# Patient Record
Sex: Male | Born: 1985 | Race: Black or African American | Hispanic: No | Marital: Single | Smoking: Current every day smoker
Health system: Southern US, Community
[De-identification: ages and names within clinical notes are randomized; demographics above are authoritative.]

## PROBLEM LIST (undated history)

## (undated) DIAGNOSIS — F1910 Other psychoactive substance abuse, uncomplicated: Secondary | ICD-10-CM

## (undated) HISTORY — DX: Other psychoactive substance abuse, uncomplicated: F19.10

---

## 2008-02-10 ENCOUNTER — Emergency Department (HOSPITAL_COMMUNITY): Admission: EM | Admit: 2008-02-10 | Discharge: 2008-02-10 | Payer: Self-pay | Admitting: Emergency Medicine
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
## 2014-01-15 ENCOUNTER — Ambulatory Visit
Admit: 2014-01-15 | Discharge: 2014-01-27 | Disposition: A | Discharge status: Home / Self Care | Payer: Self-pay | Source: Ambulatory Visit | Attending: Psychiatry | Admitting: Psychiatry

## 2014-01-15 DIAGNOSIS — F1920 Other psychoactive substance dependence, uncomplicated: Secondary | ICD-10-CM

## 2014-01-15 DIAGNOSIS — F32A Depression, unspecified: Secondary | ICD-10-CM

## 2014-01-16 NOTE — ED Notes (Signed)
Client called b/c he cannot make priority access appt that was scheduled for him at [facility] by J.Smith (he has a [facility] appt at same time). Writer calls [facility] and reschedules priority access appt for Tues 12/1 0830.
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
## 2014-01-28 ENCOUNTER — Telehealth: Payer: Self-pay

## 2014-01-28 ENCOUNTER — Ambulatory Visit: Payer: Self-pay | Admitting: Family Medicine

## 2014-01-28 ENCOUNTER — Ambulatory Visit
Admit: 2014-01-28 | Discharge: 2014-02-27 | Disposition: A | Discharge status: Home / Self Care | Payer: Self-pay | Source: Ambulatory Visit | Attending: Psychiatry | Admitting: Psychiatry

## 2014-01-28 ENCOUNTER — Encounter: Payer: Self-pay | Admitting: Family Medicine

## 2014-01-28 VITALS — BP 146/82 | HR 72 | Temp 98.2°F | Ht 73.0 in | Wt 239.0 lb

## 2014-01-28 DIAGNOSIS — Z139 Encounter for screening, unspecified: Secondary | ICD-10-CM

## 2014-01-28 DIAGNOSIS — D179 Benign lipomatous neoplasm, unspecified: Secondary | ICD-10-CM | POA: Insufficient documentation

## 2014-01-28 DIAGNOSIS — F1910 Other psychoactive substance abuse, uncomplicated: Secondary | ICD-10-CM

## 2014-01-28 DIAGNOSIS — Z7251 High risk heterosexual behavior: Secondary | ICD-10-CM | POA: Insufficient documentation

## 2014-01-28 LAB — LIPID PANEL
Chol/HDL Ratio: 2.2
Cholesterol: 138 mg/dL
HDL: 64 mg/dL
LDL Calculated: 55 mg/dL
Non HDL Cholesterol: 74 mg/dL
Triglycerides: 94 mg/dL

## 2014-01-28 LAB — CBC
Hematocrit: 45 % (ref 40–51)
Hemoglobin: 14.7 g/dL (ref 13.7–17.5)
MCH: 28 pg/cell (ref 26–32)
MCHC: 33 g/dL (ref 32–37)
MCV: 87 fL (ref 79–92)
Platelets: 280 10*3/uL (ref 150–330)
RBC: 5.2 MIL/uL (ref 4.6–6.1)
RDW: 12.4 % (ref 11.6–14.4)
WBC: 5.5 10*3/uL (ref 4.2–9.1)

## 2014-01-28 LAB — COMPREHENSIVE METABOLIC PANEL
ALT: 288 U/L — ABNORMAL HIGH (ref 0–50)
AST: 94 U/L — ABNORMAL HIGH (ref 0–50)
Albumin: 4.6 g/dL (ref 3.5–5.2)
Alk Phos: 59 U/L (ref 40–130)
Anion Gap: 12 (ref 7–16)
Bilirubin,Total: 0.3 mg/dL (ref 0.0–1.2)
CO2: 26 mmol/L (ref 20–28)
Calcium: 9.4 mg/dL (ref 9.0–10.3)
Chloride: 102 mmol/L (ref 96–108)
Creatinine: 1.13 mg/dL (ref 0.67–1.17)
GFR,Black: 100 *
GFR,Caucasian: 86 *
Glucose: 88 mg/dL (ref 60–99)
Lab: 10 mg/dL (ref 6–20)
Potassium: 4.6 mmol/L (ref 3.3–5.1)
Sodium: 140 mmol/L (ref 133–145)
Total Protein: 7.2 g/dL (ref 6.3–7.7)

## 2014-01-28 NOTE — Telephone Encounter (Signed)
Jasmine from [facility] lab called stating that they received a urine testing order, but the order was put in under clinic collect, but it should be under lab collect. Please follow up with [facility] as soon as possible, thank you.

## 2014-01-28 NOTE — Telephone Encounter (Addendum)
Action:    Lab requesting urine order from today,be changed to lab collect.  Thanks.

## 2014-01-29 LAB — HEPATITIS C ANTIBODY: Hep C Ab: POSITIVE — AB

## 2014-01-29 LAB — CHLAMYDIA PLASMID DNA AMPLIFICATION: Chlamydia Plasmid DNA Amplification: 0

## 2014-01-29 LAB — SYPHILIS SCREEN
Syphilis Screen: NEGATIVE
Syphilis Status: NONREACTIVE

## 2014-01-29 LAB — N. GONORRHOEAE DNA AMPLIFICATION: N. gonorrhoeae DNA Amplification: 0

## 2014-01-30 ENCOUNTER — Encounter: Payer: Self-pay | Admitting: Family Medicine

## 2014-01-30 DIAGNOSIS — F1910 Other psychoactive substance abuse, uncomplicated: Secondary | ICD-10-CM | POA: Insufficient documentation

## 2014-01-30 LAB — HSV 1 ANTIBODY, IGG: HSV 1 IgG: 0.52 IV

## 2014-01-30 LAB — HSV 2 ANTIBODY, IGG: HSV 2 IgG: 0.06 IV

## 2014-01-30 NOTE — Assessment & Plan Note (Signed)
Hx of significant heroine/cocaine/and etoh use.  He moved to [place] to try and get away from some his ongoing substance abuse problems.  He has now been sober for almost 3-4 weeks. He attends NA meetings.

## 2014-01-30 NOTE — Assessment & Plan Note (Signed)
Patient reports to no urinary or penile lesions however hx of significant drug use and unportected intercourse in his past.  He would like testing today for STDs.

## 2014-01-30 NOTE — Assessment & Plan Note (Signed)
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  He reports to no pain in the area.  He has had it evaluated in the past and was advised to continue with conservative tx only.

## 2014-01-30 NOTE — Progress Notes (Signed)
[facility] - Outpatient Progress Note    SUBJECTIVE    Unprotected sexual intercourse  Patient reports to no urinary or penile lesions however hx of significant drug use and unportected intercourse in his past.  He would like testing today for STDs.    Substance abuse  Hx of significant heroine/cocaine/and etoh use.  [place] to try and get away from some his ongoing substance abuse problems.  He has now been sober for almost 3-4 weeks. He attends NA meetings.    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  He reports to no pain in the area.  He has had it evaluated in the past and was advised to continue with conservative tx only.        ROS: see above    Patient's medications, allergies, past medical, surgical, social and family histories were reviewed and updated as appropriate.      OBJECTIVE    Filed Vitals:    01/28/14 1020   BP: 146/82   Pulse: 72   Temp: 36.8 C (98.2 F)   Height: 1.854 m (6\' 1" )   Weight: 108.41 kg (239 lb)       VS reviewed    Gen: NAD  CV: RRR  Back: large 10cm lipoma appreciated on mid pain; free movable on exam        ASSESSMENT & PLAN    1. Unprotected sexual intercourse  Advised protected intercouse; labs ordered as below:    Chlamydia plasmid DNA amplification    Hepatitis C antibody    Syphilis Screen w Rfx to RPR and TPPA    N. Gonorrhoeae DNA amplification    HSV 1 ANTIBODY, IGG    HSV 2 ANTIBODY, IGG   2. Lipoma  Stable; no need for further tx; cont to follow   3. Substance abuse  Encouraged to refrain from use; cont with outpt substance abuse counseling.    4. Screening  Screening and CPE at next available:  Lipid add Rfx to Drt LDL if Trig >400    Comprehensive metabolic panel    CBC         F/u: next available for CPE     Garner Nash, D.O. Dictated using Dragon 10.1

## 2014-02-07 NOTE — ED Notes (Signed)
Mr. Larry Bridges no showed for the rescheduled [facility] appointment on 01/28/14 a 8:30 am. [facility] informed Writer that he no showed three times. Writer left a message for the referrer, Case Closed until further notice.

## 2014-02-11 ENCOUNTER — Telehealth: Payer: Self-pay | Admitting: Family Medicine

## 2014-02-11 ENCOUNTER — Ambulatory Visit: Payer: Self-pay | Admitting: Family Medicine

## 2014-02-11 ENCOUNTER — Encounter: Payer: Self-pay | Admitting: Family Medicine

## 2014-02-11 DIAGNOSIS — B1920 Unspecified viral hepatitis C without hepatic coma: Secondary | ICD-10-CM | POA: Insufficient documentation

## 2014-02-11 NOTE — Telephone Encounter (Signed)
Patient no showed to follow up appointment today.    During this visit he was going to be informed of positive Hepatitis C status.    A phone call was placed to [facility] emergency housing and a message was left for him to call back the nurse line to reschedule his appointment.    No information was shared and he was confirmed to still be staying there so staffer was going to pass message on.    He will need viral load performed to confirm positive test and if confirmed will need further hepatic work up.    Will forward to suite RN so that she is aware of why call was made and that it is important that follow up be rescheduled.    [place], DO

## 2014-02-14 NOTE — Telephone Encounter (Signed)
FYI:    TC to [employer], left verbal message with staff member for pt.to call office, she agreed to [employer]. Thanks.

## 2014-02-17 NOTE — Telephone Encounter (Signed)
FYI:    Pt.returned call, inquired about his lab results; which were reviewed with him and the importance of rescheduling an appt, he agreed and is scheduled for tomorrow morning. Thanks.

## 2014-02-18 ENCOUNTER — Ambulatory Visit: Payer: Self-pay | Admitting: Family Medicine

## 2014-02-19 ENCOUNTER — Ambulatory Visit: Payer: Self-pay | Admitting: Family Medicine

## 2014-02-19 ENCOUNTER — Encounter: Payer: Self-pay | Admitting: Family Medicine

## 2014-02-19 VITALS — BP 152/73 | HR 66 | Temp 97.9°F | Ht 72.84 in | Wt 234.0 lb

## 2014-02-19 DIAGNOSIS — B1920 Unspecified viral hepatitis C without hepatic coma: Secondary | ICD-10-CM

## 2014-02-19 DIAGNOSIS — Z7251 High risk heterosexual behavior: Secondary | ICD-10-CM

## 2014-02-19 NOTE — Patient Instructions (Signed)
Hepatitis C Discharge Instructions    About this topic   Hepatitis C is a disease that harms the liver. It is caused by a virus and can either be acute or chronic. The liver becomes damaged and does not work well. The virus spreads from person to person through contact with blood. Two ways this may happen are having unprotected sex or sharing needles. Also, children born to a mother with hepatitis C may get hepatitis C. There are many other ways you may get hepatitis C. It is the most serious kind of hepatitis. Many people have hepatitis C but they do not know it.  Treatment involves drugs and sometimes a liver transplant.       What care is needed at home?    Ask your doctor what you need to do when you go home. Make sure you ask questions if you do not understand what the doctor says. This way you will know what you need to do.   Take all of your drugs exactly as the doctor ordered. You may want to use an alarm or talking pillbox to help you remember to take each dose on time.   Never stop taking your drugs or change the dose without asking your doctor first.   Heat may be used to help with belly pain. If your doctor tells you to use heat, put a heating pad on your belly for no more than 20 minutes at a time. Never go to sleep with a heating pad on as this can cause burns.   Drink 6 to 8 glasses of water each day.   Do not drink beer, wine, and mixed drinks (alcohol).   Avoid taking drugs and substances that can cause more harm to your liver. Ask your doctor before taking any drugs, vitamins, or supplements.  What follow-up care is needed?   Your condition needs close monitoring. Your doctor may ask you to make visits to the office to check on your progress. Be sure to keep these visits.  What drugs may be needed?   The doctor may order drugs to:   Help with pain   Prevent infection   Slow or stop the virus from damaging your liver   Protect you from other kinds of hepatitis  The drugs may cause  side effects. Talk to your doctor if you are having problems taking any of your drugs.  Will physical activity be limited?   You may have to limit your activity. Talk to your doctor about the right amount of activity for you.  What changes to diet are needed?   Eating a healthy diet is important during this time. This means:   Eat whole grain foods and foods high in fiber.   Choose many different fruits and vegetables. Fresh or frozen is best.   Cut back on solid fats like butter or margarine. Eat less fatty and processed foods.   Eat more low-fat or lean meats like chicken, fish, or [place]. Eat less red meat.   Avoid caffeine.   If you need help, ask to see a dietitian.  What problems could happen?    Liver damage   Liver failure  What can be done to prevent this health problem?    Do not share anything that might have blood on it. This includes razors, clippers, toothbrush, needles, etc.   Wear gloves if you need to handle an infected person's blood.   Use a condom each time you have sex. Limit  your number of sexual partners.   Make sure your tattoo artist uses sterile tools.   Do not donate blood if you have hepatitis C. Remove your donor status from your state ID card.   Handle used needles and cutting tools with care.   Place used needles in a properly labeled container.   Cover cuts and wounds with a clean dressing.  When do I need to call the doctor?    Signs of infection. These include a fever of 100.70F (38C) or higher, chills.    Throwing up, upset stomach, or loose stools that persist   Bloody or black stools   Very dark yellow urine   Yellow skin or eyes   Swelling in your belly   Itching   You are not feeling better in 2 to 3 days or you are feeling worse  Teach Back: Helping You Understand   The Teach Back Method helps you understand the information we are giving you. The idea is simple. After talking with the staff, tell them in your own words what you were just told. This  helps to make sure the staff has covered each thing clearly. It also helps to explain things that may have been a bit confusing. Before going home, make sure you are able to do these:   I can tell you about my condition.   I can tell you what changes I need to make with my diet or drugs.   I can tell you what I will do if I have problems with throwing up, upset stomach, or loose stools or my skin or eyes are yellow.  Where can I learn more?   [place]  http://www.liverfoundation.org/abouttheliver/info/hepatitisc/   Centers for Disease Control and Prevention  [employer].com.au   National Digestive Diseases Information Clearinghouse  http://digestive.[employer].co.nz   Last Reviewed Date   2012-09-07  Consumer Information Use and Disclaimer   This information is not specific medical advice and does not replace information you receive from your health care provider. This is only a brief summary of general information. It does NOT include all information about conditions, illnesses, injuries, tests, procedures, treatments, therapies, discharge instructions or life-style choices that may apply to you. You must talk with your health care provider for complete information about your health and treatment options. This information should not be used to decide whether or not to accept your health care providers advice, instructions or recommendations. Only your health care provider has the knowledge and training to provide advice that is right for you.  Copyright   Copyright  2015 [place] and its affiliates and/or licensors. All rights reserved.

## 2014-02-19 NOTE — Addendum Note (Signed)
Addended by: Lawana Chambers on: 02/19/2014 09:24 AM     Modules accepted: Orders

## 2014-02-19 NOTE — Progress Notes (Signed)
HPI:  Hepatitis C:  Patient came in on 01/28/14 after an episode of unprotected intercourse with a male partner.  He had a full panel of STD testing.  He was positive for Hepatitis C antibody.  The urine for CT/GC leaked in transit and will be collected again today.  He has not been able to come in for his results until today.  He reports IV drug use in past 1-2 years and he is not sure if he shared needles.  He also reports several unprotected sexual encounters in his lifetime.  He reports that he has been trying to "clean up my life" and that "everything happens for a reason".  He states that he thinks this may be the event that helps him change his life for the better. Praised him for his efforts.     ROS:  As above    SOC:  Denies alcohol use    Patient's current medications, allergies, and problem list were reviewed and updated.     OBJECTIVE:  BP 152/73 mmHg   Pulse 66   Temp(Src) 36.6 C (97.9 F) (Temporal)   Ht 1.85 m (6' 0.83")   Wt 106.142 kg (234 lb)   BMI 31.01 kg/m2  General appearance: alert, appears stated age, cooperative and no distress  Eyes: conjunctivae/corneas clear. PERRL,   Throat: lips, mucosa, and tongue normal; teeth and gums normal  Lungs: clear to auscultation bilaterally  Heart: regular rate and rhythm, S1, S2 normal, no murmur, click, rub or gallop  Abdomen: soft, non-tender; bowel sounds normal; no masses,  no organomegaly  Extremities: extremities normal, atraumatic, no cyanosis or edema  Neurologic: Grossly normal    ASSESSMENT:  Positive Hepatitis C antibody    PLAN:   - Discussed disease process and treatment options, handout provided   - Labs already ordered for Hep C PCR and urine GC/CT, added HIV   - Referral to [facility] at [facility]   - Avoid alcohol, acetaminophen, and continue to improve diet   - Follow-up in 4-5 weeks after seeing GI    Patient verbalized understanding of information presented,and confirmed agreement with current plan of care.  Precautions  reviewed.

## 2014-02-20 LAB — HEPATITIS C RNA, QUANTITATIVE, PCR
HCV PCR log: 5.1 IU/mL
HCV PCR: 132022 IU/mL

## 2014-02-20 LAB — HIV 1&2 ANTIGEN/ANTIBODY: HIV 1&2 ANTIGEN/ANTIBODY: NONREACTIVE

## 2014-02-20 LAB — CHLAMYDIA PLASMID DNA AMPLIFICATION: Chlamydia Plasmid DNA Amplification: 0

## 2014-02-20 LAB — N. GONORRHOEAE DNA AMPLIFICATION: N. gonorrhoeae DNA Amplification: 0

## 2014-02-24 ENCOUNTER — Telehealth: Payer: Self-pay | Admitting: Family Medicine

## 2014-02-24 NOTE — Telephone Encounter (Signed)
Left HIPAA appropriate message with mother for patient to call back.

## 2014-02-25 NOTE — Telephone Encounter (Signed)
FYI:    Pt.returned call and lab results from 02/19/14 reviewed, voiced understanding..Thanks.

## 2014-03-31 ENCOUNTER — Ambulatory Visit: Payer: Self-pay | Admitting: Family Medicine

## 2014-05-09 ENCOUNTER — Encounter: Payer: Self-pay | Admitting: Gastroenterology

## 2015-05-07 DIAGNOSIS — Z139 Encounter for screening, unspecified: Secondary | ICD-10-CM

## 2015-05-12 NOTE — Congregational Nurse Program (Signed)
Congregational Nurse Program Note  Date of Encounter: 05/07/2015  Past Medical History: No past medical history on file.  Encounter Details:     CNP Questionnaire - 05/07/15 1601    Patient Demographics   Is this a new or existing patient? New   Patient is considered a/an Not Applicable   Race African-American/Black   Patient Assistance   Location of Patient Assistance Not Applicable   Patient's financial/insurance status Low Income;Self-Pay   Uninsured Patient Yes   Interventions Counseled to make appt. with provider   Patient referred to apply for the following financial assistance [employer]   Food insecurities addressed Provided food supplies   Transportation assistance No   Assistance securing medications No   Educational health offerings Hypertension;Chronic disease;Navigating the healthcare system   Encounter Details   Primary purpose of visit Education/Health Concerns;Chronic Illness/Condition Visit   Was an Emergency Department visit averted? Not Applicable   Does patient have a medical provider? No   Patient referred to Clinic   Was a mental health screening completed? (GAINS tool) No   Does patient have dental issues? No   Does patient have vision issues? No   Since previous encounter, have you referred patient for abnormal blood pressure that resulted in a new diagnosis or medication change? No   Since previous encounter, have you referred patient for abnormal blood glucose that resulted in a new diagnosis or medication change? No   For [employer]straction Use Only   Does patient have insurance? No       B/P check

## 2016-07-22 ENCOUNTER — Other Ambulatory Visit: Payer: Self-pay

## 2016-07-22 NOTE — Progress Notes (Signed)
Writer called patient to schedule routine physical exam per [employer]. Unable to leave a message- mailbox full.

## 2016-07-29 ENCOUNTER — Other Ambulatory Visit
Admission: RE | Admit: 2016-07-29 | Discharge: 2016-07-29 | Disposition: A | Discharge status: Home / Self Care | Payer: Medicaid Other | Source: Ambulatory Visit | Attending: Family Medicine | Admitting: Family Medicine

## 2016-07-29 ENCOUNTER — Other Ambulatory Visit: Payer: Self-pay

## 2016-07-29 DIAGNOSIS — Z113 Encounter for screening for infections with a predominantly sexual mode of transmission: Secondary | ICD-10-CM | POA: Insufficient documentation

## 2016-07-29 DIAGNOSIS — Z136 Encounter for screening for cardiovascular disorders: Secondary | ICD-10-CM | POA: Insufficient documentation

## 2016-07-29 DIAGNOSIS — Z118 Encounter for screening for other infectious and parasitic diseases: Secondary | ICD-10-CM | POA: Insufficient documentation

## 2016-07-29 DIAGNOSIS — Z1159 Encounter for screening for other viral diseases: Secondary | ICD-10-CM | POA: Insufficient documentation

## 2016-07-29 DIAGNOSIS — Z13228 Encounter for screening for other metabolic disorders: Secondary | ICD-10-CM | POA: Insufficient documentation

## 2016-07-29 LAB — CBC AND DIFFERENTIAL
Baso # K/uL: 0 10*3/uL (ref 0.0–0.1)
Basophil %: 0.3 %
Eos # K/uL: 0.2 10*3/uL (ref 0.0–0.5)
Eosinophil %: 2.5 %
Hematocrit: 44 % (ref 40–51)
Hemoglobin: 14.5 g/dL (ref 13.7–17.5)
IMM Granulocytes #: 0 10*3/uL (ref 0.0–0.1)
IMM Granulocytes: 0.1 %
Lymph # K/uL: 3.3 10*3/uL (ref 1.3–3.6)
Lymphocyte %: 40.9 %
MCH: 29 pg/cell (ref 26–32)
MCHC: 33 g/dL (ref 32–37)
MCV: 88 fL (ref 79–92)
Mono # K/uL: 0.6 10*3/uL (ref 0.3–0.8)
Monocyte %: 8.1 %
Neut # K/uL: 3.8 10*3/uL (ref 1.8–5.4)
Nucl RBC # K/uL: 0 10*3/uL (ref 0.0–0.0)
Nucl RBC %: 0 /100 WBC (ref 0.0–0.2)
Platelets: 290 10*3/uL (ref 150–330)
RBC: 5.1 MIL/uL (ref 4.6–6.1)
RDW: 13.1 % (ref 11.6–14.4)
Seg Neut %: 48.1 %
WBC: 7.9 10*3/uL (ref 4.2–9.1)

## 2016-07-29 LAB — LIPID PANEL
Chol/HDL Ratio: 2.1
Cholesterol: 144 mg/dL
HDL: 68 mg/dL
LDL Calculated: 55 mg/dL
Non HDL Cholesterol: 76 mg/dL
Triglycerides: 106 mg/dL

## 2016-07-29 LAB — COMPREHENSIVE METABOLIC PANEL
ALT: 140 U/L — ABNORMAL HIGH (ref 0–50)
AST: 60 U/L — ABNORMAL HIGH (ref 0–50)
Albumin: 4.6 g/dL (ref 3.5–5.2)
Alk Phos: 62 U/L (ref 40–130)
Anion Gap: 11 (ref 7–16)
Bilirubin,Total: 0.4 mg/dL (ref 0.0–1.2)
CO2: 28 mmol/L (ref 20–28)
Calcium: 9.6 mg/dL (ref 9.0–10.3)
Chloride: 101 mmol/L (ref 96–108)
Creatinine: 0.91 mg/dL (ref 0.67–1.17)
GFR,Black: 128 *
GFR,Caucasian: 110 *
Glucose: 80 mg/dL (ref 60–99)
Lab: 13 mg/dL (ref 6–20)
Potassium: 4.6 mmol/L (ref 3.3–5.1)
Sodium: 140 mmol/L (ref 133–145)
Total Protein: 7 g/dL (ref 6.3–7.7)

## 2016-07-29 LAB — DATE/TIME NOT PROVIDED

## 2016-07-30 LAB — HEMOGLOBIN A1C: Hemoglobin A1C: 5.4 % (ref 4.0–6.0)

## 2016-08-01 LAB — HEPATITIS C ANTIBODY: Hep C Ab: POSITIVE — AB

## 2016-08-01 LAB — N. GONORRHOEAE DNA AMPLIFICATION: N. gonorrhoeae DNA Amplification: 0

## 2016-08-01 LAB — HEPATITIS C RNA, QUANTITATIVE, PCR

## 2016-08-01 LAB — HEPATITIS C PCR QNT,SERUM
HCV PCR Qnt, ser: 135882 IU/mL
HCV PCR log, ser: 5.1 IU/mL

## 2016-08-01 LAB — CHLAMYDIA PLASMID DNA AMPLIFICATION: Chlamydia Plasmid DNA Amplification: 0

## 2016-08-02 ENCOUNTER — Other Ambulatory Visit: Payer: Self-pay

## 2016-08-02 NOTE — Progress Notes (Signed)
Patient transferred care to [facility] ([facility]).

## 2016-10-10 ENCOUNTER — Emergency Department (HOSPITAL_COMMUNITY)
Admission: EM | Admit: 2016-10-10 | Discharge: 2016-10-11 | Disposition: A | Discharge status: Home / Self Care | Payer: Self-pay | Attending: Emergency Medicine | Admitting: Emergency Medicine

## 2016-10-10 ENCOUNTER — Emergency Department (HOSPITAL_COMMUNITY): Payer: Self-pay

## 2016-10-10 ENCOUNTER — Encounter (HOSPITAL_COMMUNITY): Payer: Self-pay | Admitting: Emergency Medicine

## 2016-10-10 DIAGNOSIS — R42 Dizziness and giddiness: Secondary | ICD-10-CM | POA: Insufficient documentation

## 2016-10-10 DIAGNOSIS — F17210 Nicotine dependence, cigarettes, uncomplicated: Secondary | ICD-10-CM | POA: Insufficient documentation

## 2016-10-10 DIAGNOSIS — R002 Palpitations: Secondary | ICD-10-CM | POA: Insufficient documentation

## 2016-10-10 LAB — BASIC METABOLIC PANEL
ANION GAP: 10 (ref 5–15)
BUN: 13 mg/dL (ref 6–20)
CHLORIDE: 105 mmol/L (ref 101–111)
CO2: 22 mmol/L (ref 22–32)
Calcium: 9.1 mg/dL (ref 8.9–10.3)
Creatinine, Ser: 0.95 mg/dL (ref 0.61–1.24)
GFR calc Af Amer: >60 mL/min (ref >60)
GFR calc non Af Amer: >60 mL/min (ref >60)
GLUCOSE: 83 mg/dL (ref 65–99)
POTASSIUM: 3.8 mmol/L (ref 3.5–5.1)
Sodium: 137 mmol/L (ref 135–145)

## 2016-10-10 LAB — CBC
HEMATOCRIT: 40.4 % (ref 39.0–52.0)
HEMOGLOBIN: 13.6 g/dL (ref 13.0–17.0)
MCH: 28.5 pg (ref 26.0–34.0)
MCHC: 33.7 g/dL (ref 30.0–36.0)
MCV: 84.7 fL (ref 78.0–100.0)
Platelets: 262 10*3/uL (ref 150–400)
RBC: 4.77 MIL/uL (ref 4.22–5.81)
RDW: 13.2 % (ref 11.5–15.5)
WBC: 8.3 10*3/uL (ref 4.0–10.5)

## 2016-10-10 LAB — I-STAT TROPONIN, ED: TROPONIN I, POC: 0.01 ng/mL (ref 0.00–0.08)

## 2016-10-10 NOTE — ED Triage Notes (Signed)
Pt presents to ED for intermittent chest pain, shortness of breath, palpitations and dizziness x 3 days, especially when at work.  Pt denies CP at this time.

## 2016-10-11 NOTE — ED Provider Notes (Signed)
[facility]-EMERGENCY DEPT Provider Note   CSN: [id number] Arrival date & time: 10/10/16  2049     History   Chief Complaint Chief Complaint  Patient presents with  . Palpitations  . Dizziness    HPI Charles Pham is a 31 y.o. male.  Patient presents to the emergency department with chief complaint of heart palpitations and lightheadedness. He states that his symptoms started today when he was under a lot of stress thinking about all the things he had to do. He states that he has had this happen in the past when he has been dealing with a lot of stress. He denies any shortness of breath. States that his symptoms have mostly improved. He denies any drug or alcohol use. Denies any heart or lung problems. Denies any history of PE or DVT. He has not taken anything for symptoms. There are no modifying factors.   The history is provided by the patient. No language interpreter was used.    No past medical history on file.  There are no active problems to display for this patient.   No past surgical history on file.     Home Medications    Prior to Admission medications   Not on File    Family History No family history on file.  Social History Social History  Substance Use Topics  . Smoking status: Current Every Day Smoker    Packs/day: 1.00  . Smokeless tobacco: Never Used  . Alcohol use Yes     Comment: socially     Allergies   Patient has no known allergies.   Review of Systems Review of Systems  All other systems reviewed and are negative.    Physical Exam Updated Vital Signs BP 129/90   Pulse (!) 50   Temp 98 F (36.7 C) (Oral)   Resp 12   Ht 6\' 1"  (1.854 m)   Wt 90.7 kg (200 lb)   SpO2 99%   BMI 26.39 kg/m   Physical Exam  Constitutional: He is oriented to person, place, and time. He appears well-developed and well-nourished.  HENT:  Head: Normocephalic and atraumatic.  Eyes: Pupils are equal, round, and reactive to light. Conjunctivae and EOM are  normal. Right eye exhibits no discharge. Left eye exhibits no discharge. No scleral icterus.  Neck: Normal range of motion. Neck supple. No JVD present.  Cardiovascular: Normal rate, regular rhythm and normal heart sounds.  Exam reveals no gallop and no friction rub.   No murmur heard. Pulmonary/Chest: Effort normal and breath sounds normal. No respiratory distress. He has no wheezes. He has no rales. He exhibits no tenderness.  Abdominal: Soft. He exhibits no distension and no mass. There is no tenderness. There is no rebound and no guarding.  Musculoskeletal: Normal range of motion. He exhibits no edema or tenderness.  Neurological: He is alert and oriented to person, place, and time.  Skin: Skin is warm and dry.  Psychiatric: He has a normal mood and affect. His behavior is normal. Judgment and thought content normal.  Nursing note and vitals reviewed.    ED Treatments / Results  Labs (all labs ordered are listed, but only abnormal results are displayed) Labs Reviewed  BASIC METABOLIC PANEL  CBC  I-STAT TROPONIN, ED    EKG  EKG Interpretation  Date/Time:  Monday October 10 2016 20:59:11 EDT Ventricular Rate:  86 PR Interval:  168 QRS Duration: 82 QT Interval:  372 QTC Calculation: 445 R Axis:   80 Text Interpretation:  Normal sinus rhythm Minimal voltage criteria for LVH, may be normal variant Cannot rule out Anterior infarct , age undetermined Abnormal ECG No previous ECGs available Confirmed by Zadie Rhine ([id number]) on 10/10/2016 11:57:51 PM       Radiology Dg Chest 2 View  Result Date: 10/10/2016 CLINICAL DATA:  Chest pain with shortness of breath EXAM: CHEST  2 VIEW COMPARISON:  None. FINDINGS: The heart size and mediastinal contours are within normal limits. Both lungs are clear. The visualized skeletal structures are unremarkable. IMPRESSION: No active cardiopulmonary disease. Electronically Signed   By: Jasmine Pang M.D.   On: 10/10/2016 21:37     Procedures Procedures (including critical care time)  Medications Ordered in ED Medications - No data to display   Initial Impression / Assessment and Plan / ED Course  I have reviewed the triage vital signs and the nursing notes.  Pertinent labs & imaging results that were available during my care of the patient were reviewed by me and considered in my medical decision making (see chart for details).     Patient with heart palpitations and lightheadedness. Laboratory workup is reassuring. EKG shows no ischemic findings or arrhythmias. He is PERC negative.  After my interview with the patient, he states that he really just needs a place to stay overnight.  HEART score is 1.  Will discharge to home with PCP/cards follow-up.  May benefit from Holter monitor.  I believe his symptoms could also be anxiety and stress induced.  Final Clinical Impressions(s) / ED Diagnoses   Final diagnoses:  Palpitations  Dizziness    New Prescriptions New Prescriptions   No medications on file     Felipa Furnace 10/11/16 0103    Zadie Rhine, MD 10/11/16 [phone]

## 2019-02-20 IMAGING — DX DG CHEST 2V
2 series · 2 of 2 positions shown · non-contrast
Comparison: None.

CLINICAL DATA: Chest pain with shortness of breath

EXAM:
CHEST  2 VIEW

[w chest pa]
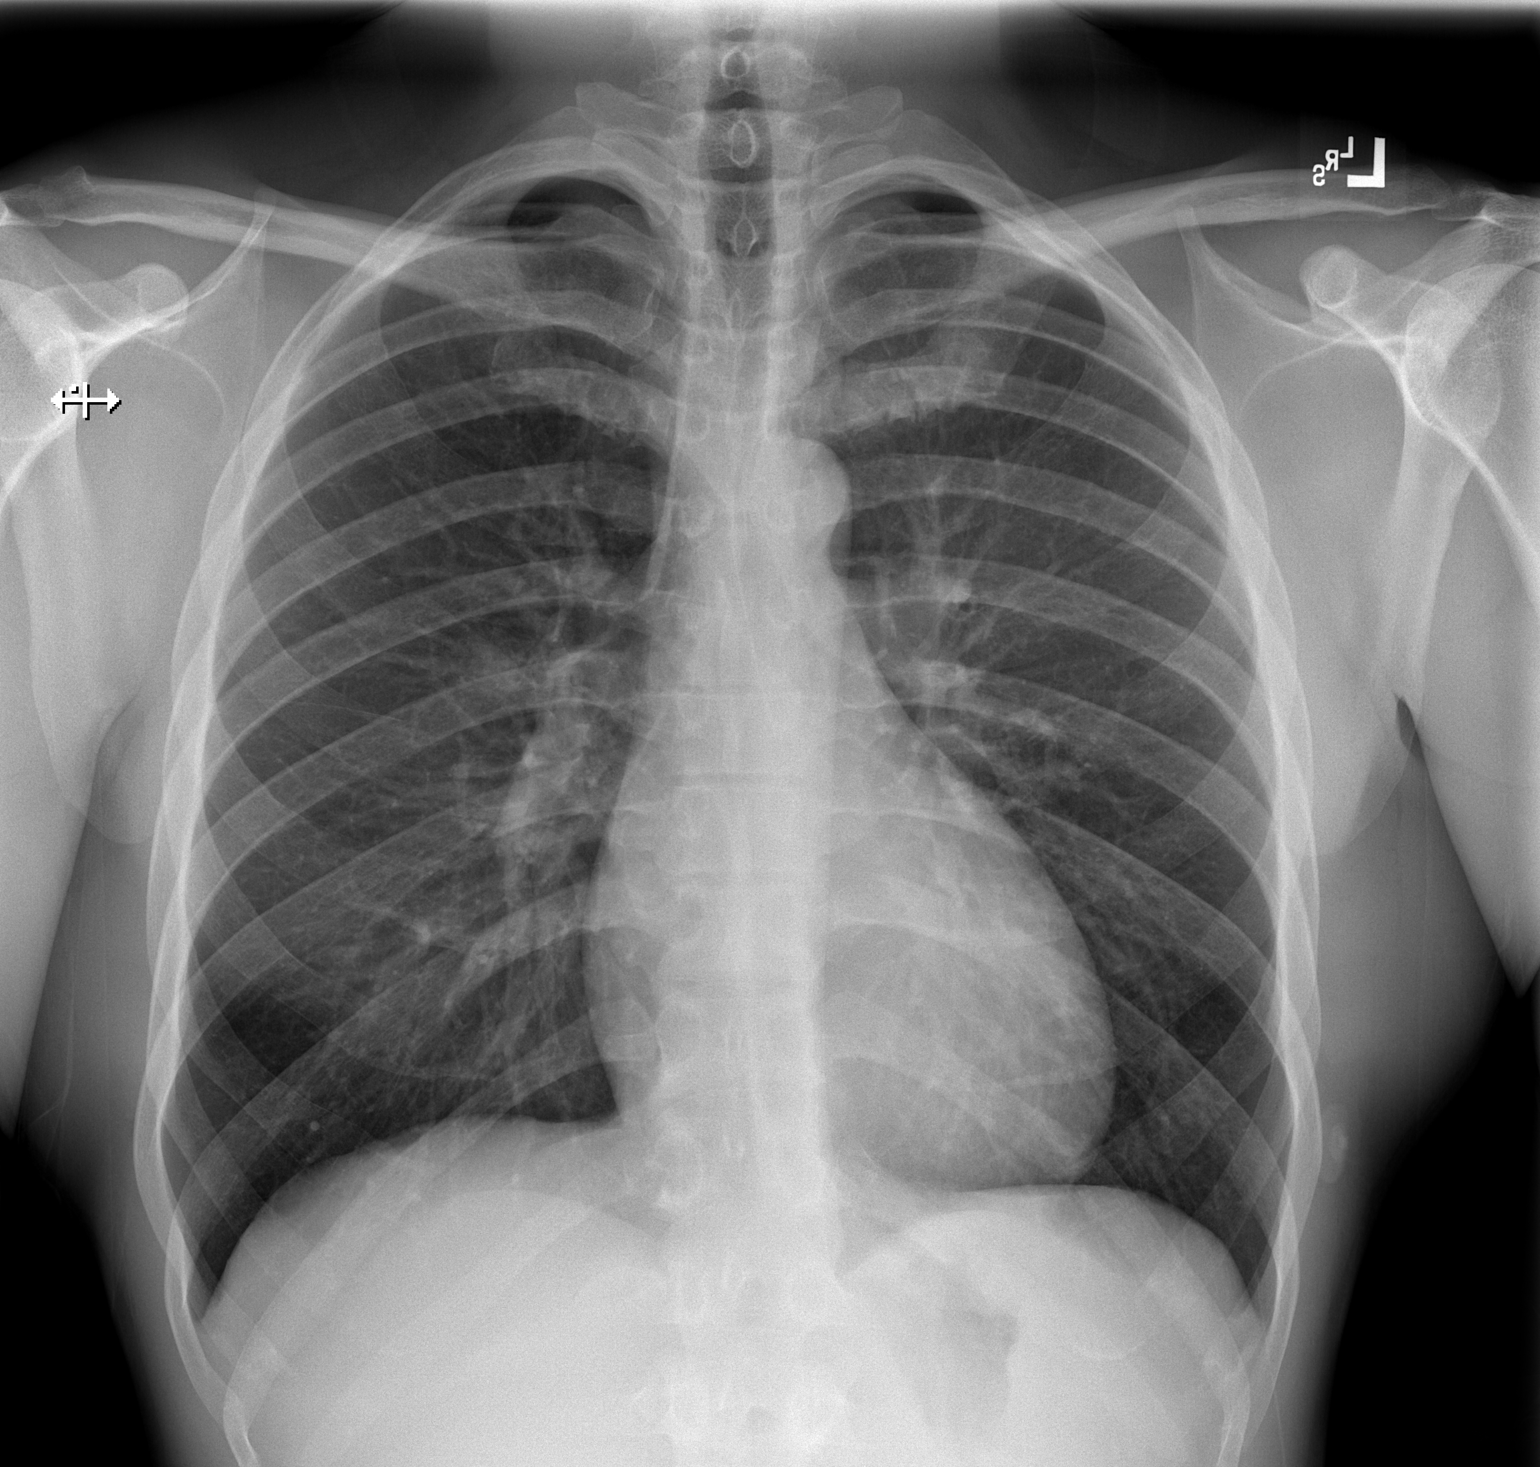

[w chest lat]
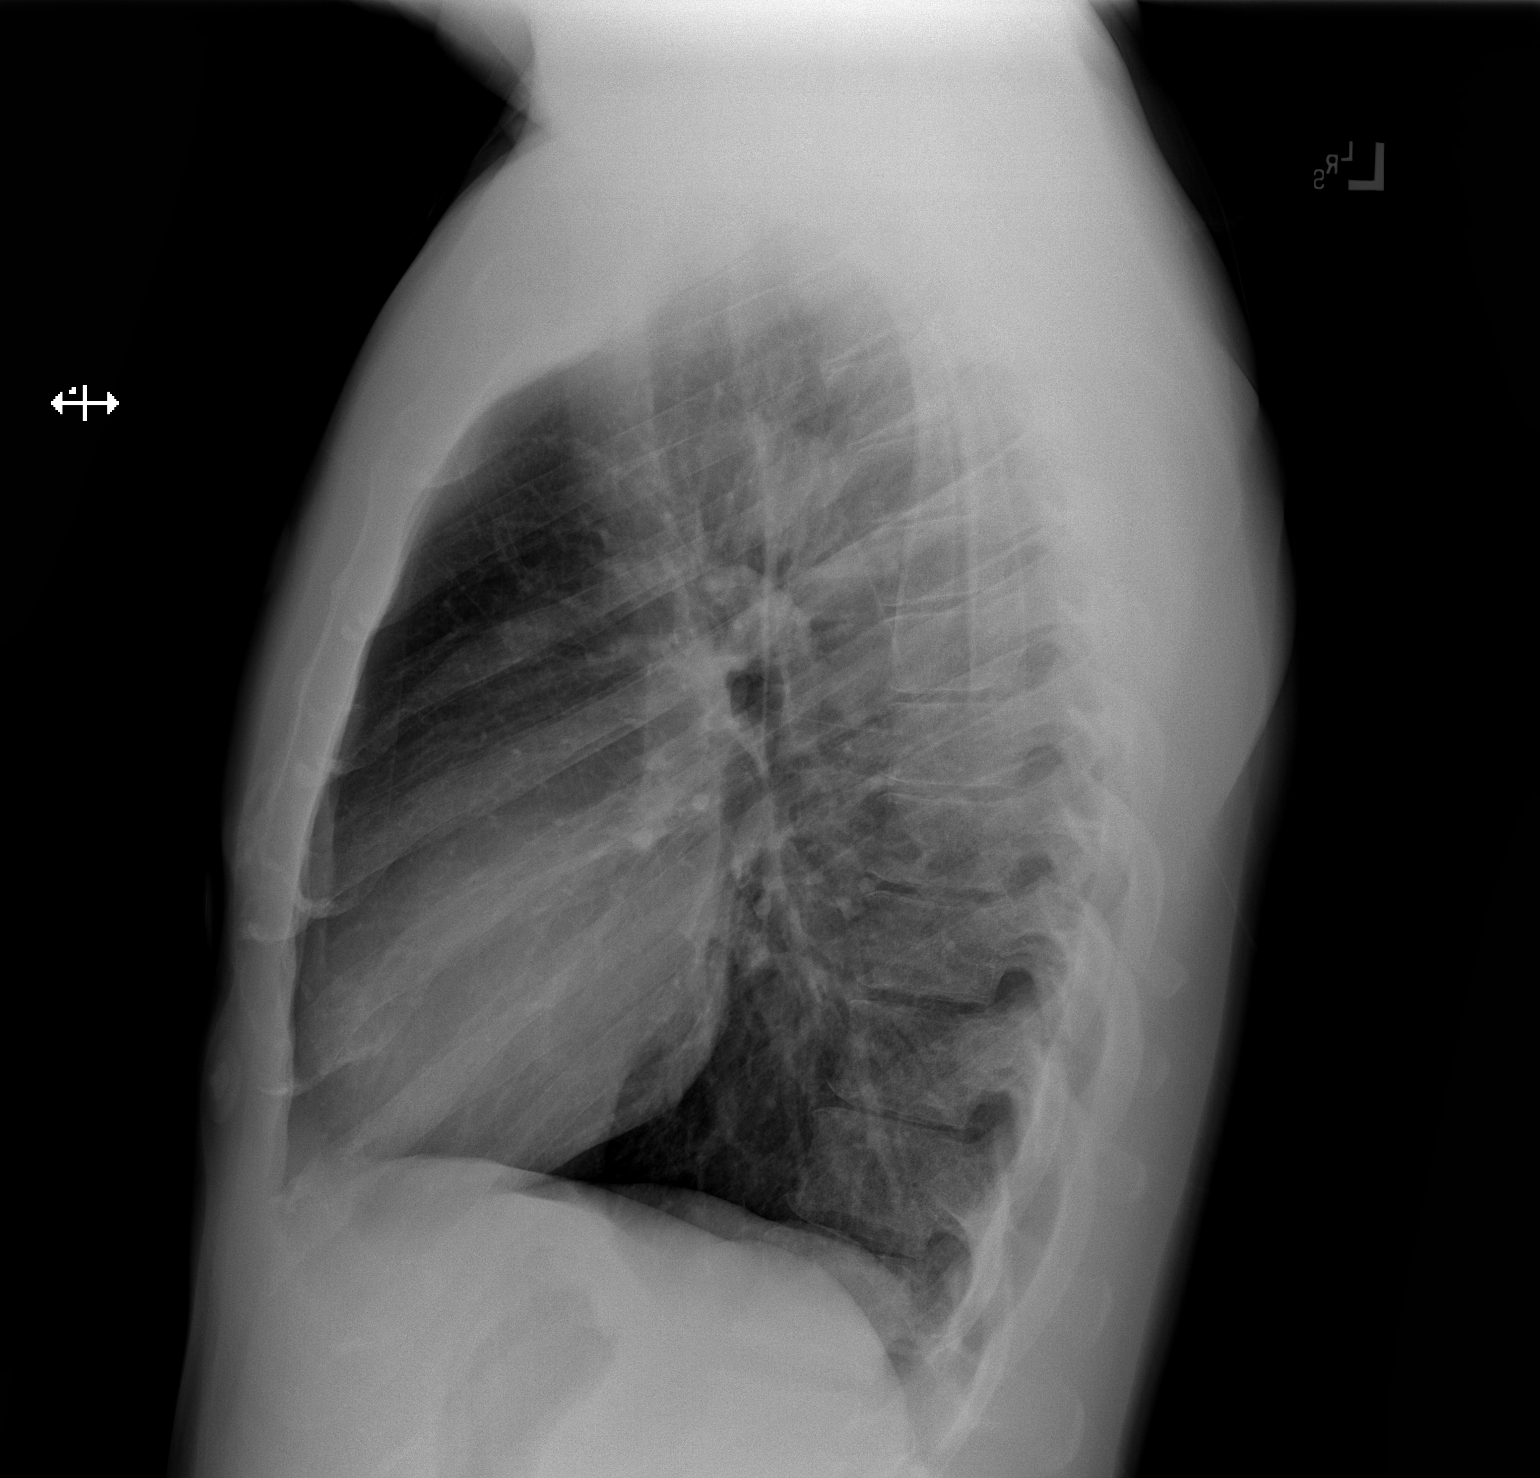

[2 of 2 positions shown; findings below may reference images not displayed]

FINDINGS: The heart size and mediastinal contours are within normal limits.
Both lungs are clear. The visualized skeletal structures are
unremarkable.
IMPRESSION: No active cardiopulmonary disease.
